# Patient Record
Sex: Female | Born: 1995 | Race: White | Hispanic: No | Smoking: Never smoker
Health system: Southern US, Community
[De-identification: ages and names within clinical notes are randomized; demographics above are authoritative.]

## PROBLEM LIST (undated history)

## (undated) ENCOUNTER — Ambulatory Visit: Admission: EM | Payer: 59 | Source: Home / Self Care

## (undated) DIAGNOSIS — F419 Anxiety disorder, unspecified: Secondary | ICD-10-CM

## (undated) DIAGNOSIS — F909 Attention-deficit hyperactivity disorder, unspecified type: Secondary | ICD-10-CM

## (undated) HISTORY — PX: TONSILLECTOMY AND ADENOIDECTOMY: SUR1326

---

## 2014-09-16 ENCOUNTER — Ambulatory Visit
Admission: EM | Admit: 2014-09-16 | Discharge: 2014-09-16 | Disposition: A | Discharge status: Home / Self Care | Payer: 59 | Attending: Family Medicine | Admitting: Family Medicine

## 2014-09-16 DIAGNOSIS — F419 Anxiety disorder, unspecified: Secondary | ICD-10-CM | POA: Insufficient documentation

## 2014-09-16 DIAGNOSIS — F909 Attention-deficit hyperactivity disorder, unspecified type: Secondary | ICD-10-CM | POA: Diagnosis not present

## 2014-09-16 DIAGNOSIS — L02619 Cutaneous abscess of unspecified foot: Secondary | ICD-10-CM

## 2014-09-16 DIAGNOSIS — L02612 Cutaneous abscess of left foot: Secondary | ICD-10-CM | POA: Insufficient documentation

## 2014-09-16 DIAGNOSIS — S90512A Abrasion, left ankle, initial encounter: Secondary | ICD-10-CM | POA: Diagnosis present

## 2014-09-16 DIAGNOSIS — L03119 Cellulitis of unspecified part of limb: Secondary | ICD-10-CM

## 2014-09-16 DIAGNOSIS — L03116 Cellulitis of left lower limb: Secondary | ICD-10-CM | POA: Insufficient documentation

## 2014-09-16 HISTORY — DX: Attention-deficit hyperactivity disorder, unspecified type: F90.9

## 2014-09-16 HISTORY — DX: Anxiety disorder, unspecified: F41.9

## 2014-09-16 LAB — PREGNANCY, URINE: PREG TEST UR: NEGATIVE

## 2014-09-16 MED ORDER — SULFAMETHOXAZOLE-TRIMETHOPRIM 800-160 MG PO TABS: 1.0000 | ORAL_TABLET | Freq: Two times a day (BID) | ORAL | Status: AC

## 2014-09-16 NOTE — ED Notes (Signed)
Pt reports that she was swimming in a river on Saturday, and scraped her left ankle against a rock. Pt has since noticed redness, swelling, tenderness. Pt has tried Neosporin, with little relief.

## 2014-09-16 NOTE — Discharge Instructions (Signed)

## 2014-09-16 NOTE — ED Provider Notes (Signed)
CSN: [id number]     Arrival date & time 09/16/14  1849 History   First MD Initiated Contact with Patient 09/16/14 1920     Chief Complaint  Patient presents with  . Abrasion   (Consider location/radiation/quality/duration/timing/severity/associated sxs/prior Treatment) HPI 19 year old female presents for evaluation of left ankle pain. She sustained an injury approximately 4 days ago when she scraped the lateral aspect of her ankle on an object in the river. She is concerned that it is becoming infected as it is becoming very red, swollen, and tender to touch. She denies any fever or chills. She is also requesting a urine pregnancy test. Last normal menstrual period was approximately 5 weeks ago. She has had intercourse since that time, but states that a condom was used. Past Medical History  Diagnosis Date  . ADHD (attention deficit hyperactivity disorder)   . Anxiety    Past Surgical History  Procedure Laterality Date  . Tonsillectomy and adenoidectomy     No family history on file. History  Substance Use Topics  . Smoking status: Never Smoker   . Smokeless tobacco: Never Used  . Alcohol Use: Yes     Comment: occasionally   OB History    Gravida Para Term Preterm AB TAB SAB Ectopic Multiple Living       Review of Systems  Constitutional: Negative for fever and chills.  Gastrointestinal: Negative for abdominal distention.  Genitourinary: Positive for menstrual problem.  Neurological: Negative for dizziness and numbness.    Allergies  Mosquito (diagnostic)  Home Medications   Prior to Admission medications   Not on File   BP 114/72 mmHg  Pulse 84  Temp(Src) 97.9 F (36.6 C) (Oral)  Resp 16  Ht  (1.626 m)  Wt 136 lb (61.689 kg)  BMI 23.33 kg/m2  SpO2 100%  LMP 08/07/2014 (Approximate) Physical Exam  Constitutional: She is oriented to person, place, and time. She appears well-developed and well-nourished.  Neurological: She is alert and  oriented to person, place, and time. She has normal reflexes.  Skin: Skin is warm and dry. Rash noted. There is erythema.       ED Course  Procedures (including critical care time) Labs Review Labs Reviewed  PREGNANCY, URINE  Negative  Imaging Review No results found.   MDM   1. Cellulitis and abscess of foot    Patient was given a prescription for Bactrim DS BID, 10 day supply. She was instructed to finish the prescription in its entirety. She was also instructed to return for reevaluation if the redness seems to be worsening rather than improving. All questions were solicited and answered.    Carlean Purl, PA-C 09/16/14 1955

## 2019-03-02 ENCOUNTER — Emergency Department
Admission: EM | Admit: 2019-03-02 | Discharge: 2019-03-03 | Disposition: A | Discharge status: Home / Self Care | Payer: 59 | Attending: Emergency Medicine | Admitting: Emergency Medicine

## 2019-03-02 ENCOUNTER — Emergency Department: Payer: 59

## 2019-03-02 ENCOUNTER — Encounter: Payer: Self-pay | Admitting: Emergency Medicine

## 2019-03-02 ENCOUNTER — Other Ambulatory Visit: Payer: Self-pay

## 2019-03-02 DIAGNOSIS — N83201 Unspecified ovarian cyst, right side: Secondary | ICD-10-CM

## 2019-03-02 DIAGNOSIS — R1031 Right lower quadrant pain: Secondary | ICD-10-CM

## 2019-03-02 DIAGNOSIS — R1011 Right upper quadrant pain: Secondary | ICD-10-CM | POA: Diagnosis not present

## 2019-03-02 LAB — URINALYSIS, COMPLETE (UACMP) WITH MICROSCOPIC
Bilirubin Urine: NEGATIVE
Glucose, UA: NEGATIVE mg/dL
Hgb urine dipstick: NEGATIVE
Ketones, ur: NEGATIVE mg/dL
Nitrite: NEGATIVE
Protein, ur: 30 mg/dL — AB
Specific Gravity, Urine: 1.026 (ref 1.005–1.030)
pH: 6 (ref 5.0–8.0)

## 2019-03-02 LAB — COMPREHENSIVE METABOLIC PANEL
ALT: 11 U/L (ref 0–44)
AST: 17 U/L (ref 15–41)
Albumin: 4.4 g/dL (ref 3.5–5.0)
Alkaline Phosphatase: 50 U/L (ref 38–126)
Anion gap: 7 (ref 5–15)
BUN: 16 mg/dL (ref 6–20)
CO2: 26 mmol/L (ref 22–32)
Calcium: 9 mg/dL (ref 8.9–10.3)
Chloride: 108 mmol/L (ref 98–111)
Creatinine, Ser: 0.73 mg/dL (ref 0.44–1.00)
GFR calc Af Amer: >60 mL/min (ref >60)
GFR calc non Af Amer: >60 mL/min (ref >60)
Glucose, Bld: 80 mg/dL (ref 70–99)
Potassium: 3.6 mmol/L (ref 3.5–5.1)
Sodium: 141 mmol/L (ref 135–145)
Total Bilirubin: 0.6 mg/dL (ref 0.3–1.2)
Total Protein: 7.4 g/dL (ref 6.5–8.1)

## 2019-03-02 LAB — CBC
HCT: 40.2 % (ref 36.0–46.0)
Hemoglobin: 13 g/dL (ref 12.0–15.0)
MCH: 29.6 pg (ref 26.0–34.0)
MCHC: 32.3 g/dL (ref 30.0–36.0)
MCV: 91.6 fL (ref 80.0–100.0)
Platelets: 227 10*3/uL (ref 150–400)
RBC: 4.39 MIL/uL (ref 3.87–5.11)
RDW: 13.2 % (ref 11.5–15.5)
WBC: 10.7 10*3/uL — ABNORMAL HIGH (ref 4.0–10.5)
nRBC: 0 % (ref 0.0–0.2)

## 2019-03-02 LAB — POCT PREGNANCY, URINE: Preg Test, Ur: NEGATIVE

## 2019-03-02 LAB — LIPASE, BLOOD: Lipase: 46 U/L (ref 11–51)

## 2019-03-02 MED ORDER — OXYCODONE-ACETAMINOPHEN 5-325 MG PO TABS
1.0000 | ORAL_TABLET | Freq: Once | ORAL | Status: AC
  Administered 2019-03-02: 1 via ORAL
  Filled 2019-03-02: qty 1

## 2019-03-02 MED ORDER — IOHEXOL 300 MG/ML  SOLN
75.0000 mL | Freq: Once | INTRAMUSCULAR | Status: AC | PRN
  Administered 2019-03-02: 75 mL via INTRAVENOUS
  Filled 2019-03-02: qty 75

## 2019-03-02 MED ORDER — TRAMADOL HCL 50 MG PO TABS: 50.0000 mg | ORAL_TABLET | Freq: Four times a day (QID) | ORAL | 0 refills | Status: AC | PRN

## 2019-03-02 NOTE — ED Provider Notes (Signed)
[facility] Emergency Department Provider Note ____________________________________________   First MD Initiated Contact with Patient 03/02/19 1759     (approximate)  I have reviewed the triage vital signs and the nursing notes.   HISTORY  Chief Complaint Abdominal Pain  HPI Suzanne Berry is a 24 y.o. female presents to the emergency department for treatment and evaluation of right lower abdominal pain.  No fever, nausea, vomiting, or diarrhea. No alleviating measures prior to arrival.        Past Medical History:  Diagnosis Date  . ADHD (attention deficit hyperactivity disorder)   . Anxiety     There are no problems to display for this patient.   Past Surgical History:  Procedure Laterality Date  . TONSILLECTOMY AND ADENOIDECTOMY      Prior to Admission medications   Medication Sig Start Date End Date Taking? Authorizing Provider  traMADol (ULTRAM) 50 MG tablet Take 1 tablet (50 mg total) by mouth every 6 (six) hours as needed for up to 2 days. 03/02/19 03/04/19  Enid Derry, PA-C    Allergies Mosquito (diagnostic)  History reviewed. No pertinent family history.  Social History Social History   Tobacco Use  . Smoking status: Never Smoker  . Smokeless tobacco: Never Used  Substance Use Topics  . Alcohol use: Yes    Comment: occasionally  . Drug use: No    Review of Systems  Constitutional: No fever/chills Eyes: No visual changes. ENT: No sore throat. Cardiovascular: Denies chest pain. Respiratory: Denies shortness of breath. Gastrointestinal: Positive for abdominal pain.  No nausea, no vomiting.  No diarrhea.  No constipation. Genitourinary: Negative for dysuria. Musculoskeletal: Negative for back pain. Skin: Negative for rash. Neurological: Negative for headaches, focal weakness or numbness. ___________________________________________   PHYSICAL EXAM:  VITAL SIGNS: ED Triage Vitals  Enc Vitals Group     BP  03/02/19 1714 128/75     Pulse Rate 03/02/19 1714 99     Resp 03/02/19 1714 16     Temp 03/02/19 1714 98.6 F (37 C)     Temp Source 03/02/19 1714 Oral     SpO2 03/02/19 1714 100 %     Weight 03/02/19 1709 136 lb (61.7 kg)     Height 03/02/19 1709 5\' 4"  (1.626 m)     Head Circumference --      Peak Flow --      Pain Score 03/02/19 1709 8     Pain Loc --      Pain Edu? --      Excl. in GC? --     Constitutional: Alert and oriented. Well appearing and in no acute distress. Eyes: Conjunctivae are normal. PERRL. Head: Atraumatic. Nose: No congestion/rhinnorhea. Mouth/Throat: Mucous membranes are moist.  Oropharynx non-erythematous. Neck: No stridor.   Hematological/Lymphatic/Immunilogical: No cervical lymphadenopathy. Cardiovascular: Normal rate, regular rhythm. Grossly normal heart sounds.  Good peripheral circulation. Respiratory: Normal respiratory effort.  No retractions. Lungs CTAB. Gastrointestinal: Soft and tender in the right upper quadrant and mildly tender in the right lower quadrant without rebound or guarding. No distention. No abdominal bruits. No CVA tenderness. Musculoskeletal: No lower extremity tenderness nor edema.  No joint effusions. Neurologic:  Normal speech and language. No gross focal neurologic deficits are appreciated. No gait instability. Skin:  Skin is warm, dry and intact. No rash noted. Psychiatric: Mood and affect are normal. Speech and behavior are normal.  ____________________________________________   LABS (all labs ordered are listed, but only abnormal results are displayed)  Labs Reviewed  CBC - Abnormal; Notable for the following components:      Result Value   WBC 10.7 (*)    All other components within normal limits  URINALYSIS, COMPLETE (UACMP) WITH MICROSCOPIC - Abnormal; Notable for the following components:   Color, Urine YELLOW (*)    APPearance CLOUDY (*)    Protein, ur 30 (*)    Leukocytes,Ua SMALL (*)    Bacteria, UA RARE (*)      All other components within normal limits  LIPASE, BLOOD  COMPREHENSIVE METABOLIC PANEL  POC URINE PREG, ED  POCT PREGNANCY, URINE   ____________________________________________  EKG  Not indicated. ____________________________________________  RADIOLOGY  ED MD interpretation:    Negative RUQ ultrasound.  Official radiology report(s): CT ABDOMEN PELVIS W CONTRAST  Result Date: 03/02/2019 CLINICAL DATA:  Unspecified abdominal pain. Cramping and left lower abdominal pain today. EXAM: CT ABDOMEN AND PELVIS WITH CONTRAST TECHNIQUE: Multidetector CT imaging of the abdomen and pelvis was performed using the standard protocol following bolus administration of intravenous contrast. CONTRAST:  54mL OMNIPAQUE IOHEXOL 300 MG/ML  SOLN COMPARISON:  Right upper quadrant ultrasound today. FINDINGS: Lower chest: The lung bases are clear. Hepatobiliary: No focal liver abnormality is seen. No gallstones, gallbladder wall thickening, or biliary dilatation. Pancreas: No ductal dilatation or inflammation. Spleen: Normal in size without focal abnormality. Adrenals/Urinary Tract: Normal adrenal glands. No hydronephrosis or perinephric edema. Homogeneous renal enhancement. Urinary bladder is partially distended without wall thickening. Stomach/Bowel: Stomach is within normal limits. Appendix appears normal, coursing in the right pericolic gutter with tip abutting the inferior liver. No evidence of bowel wall thickening, distention, or inflammatory changes. Moderate to large volume of stool throughout the colon. Vascular/Lymphatic: Normal caliber abdominal aorta. The portal vein is patent. Acute vascular findings. No adenopathy. Reproductive: Simple cyst in the right ovary measures 3.2 cm. Uterus and left ovary are unremarkable. Other: No free air, free fluid, or intra-abdominal fluid collection. Tiny fat containing umbilical hernia. Musculoskeletal: There are no acute or suspicious osseous abnormalities.  IMPRESSION: 1. Moderate to large volume of stool throughout the colon, can be seen with constipation. 2. Simple 3.2 cm right ovarian cyst. Unless there is focal right lower quadrant pain, no dedicated imaging follow-up is needed. This recommendation follows ACR consensus guidelines: White Paper of the ACR Incidental Findings Committee II on Adnexal Findings. J Am Coll Radiol 2013:10: Electronically Signed   By: Keith Rake M.D.   On: 03/02/2019 21:20   US PELVIC COMPLETE W TRANSVAGINAL AND TORSION R/O  Result Date: 03/02/2019 CLINICAL DATA:  Right lower quadrant pain. Right ovarian cyst on CT. EXAM: TRANSABDOMINAL AND TRANSVAGINAL ULTRASOUND OF PELVIS DOPPLER ULTRASOUND OF OVARIES TECHNIQUE: Both transabdominal and transvaginal ultrasound examinations of the pelvis were performed. Transabdominal technique was performed for global imaging of the pelvis including uterus, ovaries, adnexal regions, and pelvic cul-de-sac. It was necessary to proceed with endovaginal exam following the transabdominal exam to visualize the uterus, ovaries and adnexa. Color and duplex Doppler ultrasound was utilized to evaluate blood flow to the ovaries. COMPARISON:  Abdominal CT earlier this day FINDINGS: Uterus Measurements: 8.1 x 3.4 x 4.3 cm = volume: 62.9 mL. No fibroids or other mass visualized. Endometrium Thickness: 5 mm.  No focal abnormality visualized. Right ovary Measurements: 4.0 x 3.9 x 4.1 cm = volume: 33.4 mL. Simple cyst measures 3.5 x 3.1 x 3.3 cm. Normal blood flow to the adjacent ovarian parenchyma. No extra ovarian mass. Left ovary Measurements: 3.6 x 2.2 x 2.4 cm =  volume: 7.1 mL. Normal appearance/no adnexal mass. Pulsed Doppler evaluation of both ovaries demonstrates normal low-resistance arterial and venous waveforms. Other findings No abnormal free fluid. IMPRESSION: 1. Simple right ovarian cyst measuring 3.5 cm. No dedicated imaging follow-up is needed. 2. Normal blood flow to both ovaries without  torsion. 3. Normal sonographic appearance of the uterus. Electronically Signed   By: Narda Rutherford M.D.   On: 03/02/2019 23:57   US Abdomen Limited RUQ  Result Date: 03/02/2019 CLINICAL DATA:  Right upper quadrant abdominal pain. EXAM: ULTRASOUND ABDOMEN LIMITED RIGHT UPPER QUADRANT COMPARISON:  None. FINDINGS: Gallbladder: No gallstones or wall thickening visualized. No sonographic Murphy sign noted by sonographer. Common bile duct: Diameter: 2 mm Liver: No focal lesion identified. Within normal limits in parenchymal echogenicity. Portal vein is patent on color Doppler imaging with normal direction of blood flow towards the liver. Other: None. IMPRESSION: Normal study. Electronically Signed   By: Katherine Mantle M.D.   On: 03/02/2019 19:41    ____________________________________________   PROCEDURES  Procedure(s) performed (including Critical Care):  Procedures  ____________________________________________   INITIAL IMPRESSION / ASSESSMENT AND PLAN     24 year old female presenting to the emergency department for treatment and evaluation of abdominal pain.  See HPI for further details.  Plan will be to draw labs and start with a ultrasound of the right upper quadrant as this is the most focal location of her pain.  On exam, it is difficult to isolate a single area of pain.  If the ultrasound of the right upper quadrant is negative.  Will perform CT of the abdomen and pelvis.  Patient is aware and agrees with this plan.  DIFFERENTIAL DIAGNOSIS  Acute cholecystitis, cholelithiasis, appendicitis, diverticulitis, constipation, bowel gas, adenitis  ED COURSE  Vital signs are stable.  She does have very mild leukocytosis at 10.7 without a left shift.  Right upper quadrant ultrasound is negative.  We will proceed with CT of the abdomen pelvis as decided previously.  Patient care will be transitioned to Enid Derry, PA-C for final evaluation and  diagnosis. ____________________________________________   FINAL CLINICAL IMPRESSION(S) / ED DIAGNOSES  Final diagnoses:  RUQ pain  RLQ discomfort  Cyst of right ovary     ED Discharge Orders         Ordered    traMADol (ULTRAM) 50 MG tablet  Every 6 hours PRN     03/02/19 2352           Note:  This document was prepared using Dragon voice recognition software and may include unintentional dictation errors.   Chinita Pester, FNP 03/03/19 1655    Emily Filbert, MD 03/03/19 [phone]

## 2019-03-02 NOTE — ED Triage Notes (Signed)
Pt c/o right lower abdominal pain. Denies fever, NVD. NAD. Ambulatory. Color WNL.

## 2019-03-02 NOTE — ED Notes (Addendum)
Pt with cramping for over a week and severe pain in left lower abdomen today. Pt denies N/V/D.

## 2019-03-02 NOTE — ED Provider Notes (Signed)
-----------------------------------------   9:29 PM on 03/02/2019 -----------------------------------------  Blood pressure 128/75, pulse 99, temperature 98.6 F (37 C), temperature source Oral, resp. rate 16, height 5\' 4"  (1.626 m), weight 61.7 kg, last menstrual period 02/13/2019, SpO2 100 %.  Assuming care from [facility].  In short, Suzanne Berry is a 24 y.o. female with a chief complaint of right lower quadrant Abdominal Pain for one week, worsening today. Refer to the original H&P for additional details.   Patient has a mild leukocytosis of 10.7.  Remaining labs are unremarkable.  Patient has some white blood cells, leukocytes, rare bacteria on urinalysis but denies any urinary symptoms.  Right upper quadrant ultrasound within normal limits.  CT scan consistent with moderate stool burden and 3.2 cm right ovarian cyst.  Pelvic ultrasound with torsion rule out was ordered.  Pelvic ultrasound consistent with right ovarian cyst and no torsion.  Patient will be discharged home with prescription for tramadol for pain.  Referral to gynecology was given.  Return precautions for the emergency department were given.  Patient agreeable and comfortable with plan of care.    22, PA-C 03/03/19 0003    03/05/19, MD 03/07/19 [phone]

## 2019-03-12 ENCOUNTER — Emergency Department: Payer: 59

## 2019-03-12 ENCOUNTER — Other Ambulatory Visit: Payer: Self-pay

## 2019-03-12 ENCOUNTER — Emergency Department
Admission: EM | Admit: 2019-03-12 | Discharge: 2019-03-12 | Disposition: A | Discharge status: Home / Self Care | Payer: 59 | Attending: Emergency Medicine | Admitting: Emergency Medicine

## 2019-03-12 DIAGNOSIS — R569 Unspecified convulsions: Secondary | ICD-10-CM | POA: Diagnosis not present

## 2019-03-12 LAB — URINALYSIS, COMPLETE (UACMP) WITH MICROSCOPIC
Bacteria, UA: NONE SEEN
Bilirubin Urine: NEGATIVE
Glucose, UA: NEGATIVE mg/dL
Ketones, ur: 5 mg/dL — AB
Leukocytes,Ua: NEGATIVE
Nitrite: NEGATIVE
Protein, ur: 100 mg/dL — AB
Specific Gravity, Urine: 1.024 (ref 1.005–1.030)
pH: 5 (ref 5.0–8.0)

## 2019-03-12 LAB — POCT PREGNANCY, URINE: Preg Test, Ur: NEGATIVE

## 2019-03-12 LAB — COMPREHENSIVE METABOLIC PANEL
ALT: 11 U/L (ref 0–44)
AST: 27 U/L (ref 15–41)
Albumin: 4.8 g/dL (ref 3.5–5.0)
Alkaline Phosphatase: 53 U/L (ref 38–126)
Anion gap: 13 (ref 5–15)
BUN: 10 mg/dL (ref 6–20)
CO2: 16 mmol/L — ABNORMAL LOW (ref 22–32)
Calcium: 9.2 mg/dL (ref 8.9–10.3)
Chloride: 108 mmol/L (ref 98–111)
Creatinine, Ser: 0.94 mg/dL (ref 0.44–1.00)
GFR calc Af Amer: >60 mL/min (ref >60)
GFR calc non Af Amer: >60 mL/min (ref >60)
Glucose, Bld: 135 mg/dL — ABNORMAL HIGH (ref 70–99)
Potassium: 3.5 mmol/L (ref 3.5–5.1)
Sodium: 137 mmol/L (ref 135–145)
Total Bilirubin: 1.1 mg/dL (ref 0.3–1.2)
Total Protein: 7.5 g/dL (ref 6.5–8.1)

## 2019-03-12 LAB — CBC WITH DIFFERENTIAL/PLATELET
Abs Immature Granulocytes: 0.06 10*3/uL (ref 0.00–0.07)
Basophils Absolute: 0 10*3/uL (ref 0.0–0.1)
Basophils Relative: 0 %
Eosinophils Absolute: 0 10*3/uL (ref 0.0–0.5)
Eosinophils Relative: 0 %
HCT: 42.6 % (ref 36.0–46.0)
Hemoglobin: 14.1 g/dL (ref 12.0–15.0)
Immature Granulocytes: 1 %
Lymphocytes Relative: 24 %
Lymphs Abs: 2.3 10*3/uL (ref 0.7–4.0)
MCH: 29.9 pg (ref 26.0–34.0)
MCHC: 33.1 g/dL (ref 30.0–36.0)
MCV: 90.4 fL (ref 80.0–100.0)
Monocytes Absolute: 0.7 10*3/uL (ref 0.1–1.0)
Monocytes Relative: 7 %
Neutro Abs: 6.8 10*3/uL (ref 1.7–7.7)
Neutrophils Relative %: 68 %
Platelets: 205 10*3/uL (ref 150–400)
RBC: 4.71 MIL/uL (ref 3.87–5.11)
RDW: 13.2 % (ref 11.5–15.5)
WBC: 9.9 10*3/uL (ref 4.0–10.5)
nRBC: 0 % (ref 0.0–0.2)

## 2019-03-12 LAB — GLUCOSE, CAPILLARY: Glucose-Capillary: 129 mg/dL — ABNORMAL HIGH (ref 70–99)

## 2019-03-12 MED ORDER — SODIUM CHLORIDE 0.9 % IV BOLUS
1000.0000 mL | Freq: Once | INTRAVENOUS | Status: AC
  Administered 2019-03-12: 17:00:00 1000 mL via INTRAVENOUS

## 2019-03-12 NOTE — ED Notes (Signed)
Pt given phone to update parents.

## 2019-03-12 NOTE — ED Notes (Signed)
Pt assisted to bathroom

## 2019-03-12 NOTE — ED Provider Notes (Signed)
[facility] Emergency Department Provider Note ____________________________________________   First MD Initiated Contact with Patient 03/12/19 1643     (approximate)  I have reviewed the triage vital signs and the nursing notes.   HISTORY  Chief Complaint Seizures    HPI Suzanne Berry is a 24 y.o. female with PMH as noted below who presents with a possible seizure or syncope.  The patient states that she does not member anything about what happened, but awoke to her coworkers around her.  They reported seizure-like activity EMS.  The patient states that she was feeling fine earlier, and denies any lightheadedness, nausea, or other preceding symptoms.  She states that she has had "seizures" before, but describes those as episodes where she just "spaces out" and has not had an episode like this where she lost consciousness.  She has not had any work-up for this before.  She did not bite her tongue and had no incontinence.  She denies any specific symptoms now other than feeling a bit tired.  She states she is not on any medications currently.  She was on Wellbutrin before, but stopped taking around a year ago.  Past Medical History:  Diagnosis Date  . ADHD (attention deficit hyperactivity disorder)   . Anxiety     There are no problems to display for this patient.   Past Surgical History:  Procedure Laterality Date  . TONSILLECTOMY AND ADENOIDECTOMY      Prior to Admission medications   Not on File    Allergies Mosquito (diagnostic)  History reviewed. No pertinent family history.  Social History Social History   Tobacco Use  . Smoking status: Never Smoker  . Smokeless tobacco: Never Used  Substance Use Topics  . Alcohol use: Yes    Comment: occasionally  . Drug use: No    Review of Systems  Constitutional: No fever. Eyes: No redness. ENT: No sore throat. Cardiovascular: Denies chest pain. Respiratory: Denies shortness of  breath. Gastrointestinal: No vomiting or diarrhea.  Genitourinary: Negative for dysuria.  Musculoskeletal: Negative for back pain. Skin: Negative for rash. Neurological: Negative for headache.   ____________________________________________   PHYSICAL EXAM:  VITAL SIGNS: ED Triage Vitals  Enc Vitals Group     BP 03/12/19 1638 (!) 146/81     Pulse Rate 03/12/19 1642 (!) 120     Resp 03/12/19 1642 (!) 26     Temp 03/12/19 1639 99.1 F (37.3 C)     Temp Source 03/12/19 1639 Oral     SpO2 03/12/19 1642 100 %     Weight 03/12/19 1639 130 lb (59 kg)     Height 03/12/19 1639 5\' 4"  (1.626 m)     Head Circumference --      Peak Flow --      Pain Score 03/12/19 1639 0     Pain Loc --      Pain Edu? --      Excl. in GC? --     Constitutional: Alert and oriented.  Relatively well appearing and in no acute distress. Eyes: Conjunctivae are normal.  EOMI.  PERRLA. Head: Atraumatic. Nose: No congestion/rhinnorhea. Mouth/Throat: Mucous membranes are moist.   Neck: Normal range of motion.  No meningeal signs. Cardiovascular: Tachycardic, regular rhythm.   Good peripheral circulation. Respiratory: Normal respiratory effort.  No retractions.  Gastrointestinal: Soft and nontender.  Musculoskeletal: Extremities warm and well perfused.  Neurologic:  Normal speech and language.  Motor and sensory intact in all extremities.  Normal  coordination on finger-to-nose.  No pronator drift. Skin:  Skin is warm and dry. No rash noted. Psychiatric: Mood and affect are normal. Speech and behavior are normal.  ____________________________________________   LABS (all labs ordered are listed, but only abnormal results are displayed)  Labs Reviewed  COMPREHENSIVE METABOLIC PANEL - Abnormal; Notable for the following components:      Result Value   CO2 16 (*)    Glucose, Bld 135 (*)    All other components within normal limits  URINALYSIS, COMPLETE (UACMP) WITH MICROSCOPIC - Abnormal; Notable for the  following components:   Color, Urine YELLOW (*)    APPearance CLOUDY (*)    Hgb urine dipstick LARGE (*)    Ketones, ur 5 (*)    Protein, ur 100 (*)    All other components within normal limits  GLUCOSE, CAPILLARY - Abnormal; Notable for the following components:   Glucose-Capillary 129 (*)    All other components within normal limits  CBC WITH DIFFERENTIAL/PLATELET  POC URINE PREG, ED  POCT PREGNANCY, URINE   ____________________________________________  EKG  ED ECG REPORT I, Arta Silence, the attending physician, personally viewed and interpreted this ECG.  Date: 03/12/2019 EKG Time: 1641 Rate: 107 Rhythm: Sinus tachycardia QRS Axis: Borderline right axis Intervals: normal ST/T Wave abnormalities: normal Narrative Interpretation: no evidence of acute ischemia  ____________________________________________  RADIOLOGY  CT head: No acute abnormality  ____________________________________________   PROCEDURES  Procedure(s) performed: No  Procedures  Critical Care performed: No ____________________________________________   INITIAL IMPRESSION / ASSESSMENT AND PLAN / ED COURSE  Pertinent labs & imaging results that were available during my care of the patient were reviewed by me and considered in my medical decision making (see chart for details).  24 year old female with PMH as noted above presents with possible seizure versus syncope.  The patient states that she suddenly lost consciousness while at work at a [employer], and had no significant prodromal symptoms.  Per EMS, coworkers reported some seizure-like activity.  The patient has had previous episodes which she refers to as "seizures" but has not lost consciousness during them before.  She has not had a prior work-up for this.  I reviewed the past medical records in [place].  The patient was last seen in our ED on 1/23 for abdominal pain.  She has no other recent ED visits or admissions, and I do  not see any prior visits for seizure-like episodes or any brain imaging.  On exam, the patient is slightly tachycardic with otherwise normal vital signs.  She is alert and oriented x4 and comfortable appearing.  Neurologic exam is normal.  The physical exam is otherwise unremarkable.  Etiology of the episode is slightly unclear.  The lack of prodrome and the reported seizure-like activity favor a seizure, although patient had no tongue biting or incontinence.  Vasovagal or other syncope is on the differential.  We will obtain a first-time seizure type work-up with EKG, labs, and CT head.  If this work-up is negative and the patient has no further symptoms or other episodes in the, I anticipate discharge home with close outpatient follow-up.  ----------------------------------------- 7:33 PM on 03/12/2019 -----------------------------------------  CT head shows no acute findings.  Lab work-up is unremarkable.  The patient's UA shows some protein and WBCs, but no specific findings to suggest infection.  She has no urinary symptoms.  There is no indication for further work-up at this time.  On reassessment, the patient feels comfortable.  She has not  had any further episodes in the ED.  She is stable for discharge home at this time.  I instructed her to follow-up with her PMD.  There is no indication to start her on an antiepileptic at this time, she will need additional outpatient work-up.  I had an extensive discussion with her about the results of the work-up and the plan of care.  Return precautions given, and she expresses understanding.  ____________________________________________   FINAL CLINICAL IMPRESSION(S) / ED DIAGNOSES  Final diagnoses:  Seizure-like activity (HCC)      NEW MEDICATIONS STARTED DURING THIS VISIT:  There are no discharge medications for this patient.    Note:  This document was prepared using Dragon voice recognition software and may include unintentional  dictation errors.    Dionne Bucy, MD 03/12/19 [phone]

## 2019-03-12 NOTE — ED Triage Notes (Signed)
Pt here via [facility] from work after having a witnessed seizure. Pt does not remember the events leading up to the seizure.   Pt's last seizure was 6 months ago but she did not follow up about it. Has also had several seizures prior to that.   EMS VSS- cbg 152, hr 120, bp 136/78, o2 98% ra.

## 2019-03-12 NOTE — Discharge Instructions (Addendum)
It is possible that you had a seizure, although you also may just have passed out (which is called syncope).  You should make an appointment to follow-up with your primary care doctor within the next few weeks.  Return to the ER immediately for new, worsening, or recurrent episodes like this, dizziness, weakness, or any other new or worsening symptoms that concern you.

## 2019-03-12 NOTE — ED Notes (Signed)
ED Provider at bedside. 

## 2019-05-11 ENCOUNTER — Encounter: Payer: Self-pay | Admitting: Emergency Medicine

## 2019-05-11 ENCOUNTER — Other Ambulatory Visit: Payer: Self-pay

## 2019-05-11 ENCOUNTER — Ambulatory Visit
Admission: EM | Admit: 2019-05-11 | Discharge: 2019-05-11 | Disposition: A | Discharge status: Home / Self Care | Payer: 59 | Attending: Family Medicine | Admitting: Family Medicine

## 2019-05-11 DIAGNOSIS — R1032 Left lower quadrant pain: Secondary | ICD-10-CM | POA: Insufficient documentation

## 2019-05-11 DIAGNOSIS — N939 Abnormal uterine and vaginal bleeding, unspecified: Secondary | ICD-10-CM | POA: Diagnosis present

## 2019-05-11 DIAGNOSIS — R1031 Right lower quadrant pain: Secondary | ICD-10-CM

## 2019-05-11 LAB — CBC WITH DIFFERENTIAL/PLATELET
Abs Immature Granulocytes: 0.01 10*3/uL (ref 0.00–0.07)
Basophils Absolute: 0 10*3/uL (ref 0.0–0.1)
Basophils Relative: 1 %
Eosinophils Absolute: 0 10*3/uL (ref 0.0–0.5)
Eosinophils Relative: 0 %
HCT: 37.6 % (ref 36.0–46.0)
Hemoglobin: 12.5 g/dL (ref 12.0–15.0)
Immature Granulocytes: 0 %
Lymphocytes Relative: 25 %
Lymphs Abs: 1.9 10*3/uL (ref 0.7–4.0)
MCH: 30.1 pg (ref 26.0–34.0)
MCHC: 33.2 g/dL (ref 30.0–36.0)
MCV: 90.6 fL (ref 80.0–100.0)
Monocytes Absolute: 0.7 10*3/uL (ref 0.1–1.0)
Monocytes Relative: 9 %
Neutro Abs: 5 10*3/uL (ref 1.7–7.7)
Neutrophils Relative %: 65 %
Platelets: 225 10*3/uL (ref 150–400)
RBC: 4.15 MIL/uL (ref 3.87–5.11)
RDW: 13.6 % (ref 11.5–15.5)
WBC: 7.7 10*3/uL (ref 4.0–10.5)
nRBC: 0 % (ref 0.0–0.2)

## 2019-05-11 MED ORDER — KETOROLAC TROMETHAMINE 60 MG/2ML IM SOLN
60.0000 mg | Freq: Once | INTRAMUSCULAR | Status: AC
  Administered 2019-05-11: 17:00:00 60 mg via INTRAMUSCULAR

## 2019-05-11 MED ORDER — TRAMADOL HCL 50 MG PO TABS: ORAL_TABLET | ORAL | 0 refills | Status: AC

## 2019-05-11 NOTE — ED Triage Notes (Signed)
Patient in today c/o abdominal pain x 2-3 days, getting worse today. Patient has been on her menstrual cycle for 19 days. Patient has a history of ovarian cysts.

## 2019-05-11 NOTE — Discharge Instructions (Addendum)
Follow up with Primary Care provider and/or gynecologist

## 2019-05-11 NOTE — ED Provider Notes (Signed)
[facility]    CSN: [id number] Arrival date & time: 05/11/19  1537      History   Chief Complaint Chief Complaint  Patient presents with  . Abdominal Pain    HPI Suzanne Berry is a 24 y.o. female.   24 yo female with a c/o lower bilateral abdominal pain for the past 2-3 days associated with intermittent vaginal bleeding and spotting for the past 19 days. Denies any fevers, chills, vaginal discharge, dysuria, nausea, vomiting, diarrhea, constipation, melena, hematochezia. Patient states she has a h/o ovarian cysts and has had similar pain episodes in the past. States she's had tramadol prescribed in the past which has helped. Denies any chance of pregnancy as she states that she has not had any intercourse this year.    Abdominal Pain   Past Medical History:  Diagnosis Date  . ADHD (attention deficit hyperactivity disorder)   . Anxiety     There are no problems to display for this patient.   Past Surgical History:  Procedure Laterality Date  . TONSILLECTOMY AND ADENOIDECTOMY      OB History    Gravida  0   Para  0   Term  0   Preterm  0   AB  0   Living  0     SAB  0   TAB  0   Ectopic  0   Multiple  0   Live Births               Home Medications    Prior to Admission medications   Medication Sig Start Date End Date Taking? Authorizing Provider  traMADol Janean Sark) 50 MG tablet 1-2 tabs po bid prn 05/11/19   Payton Mccallum, MD    Family History Family History  Problem Relation Age of Onset  . Thyroid disease Mother   . Depression Mother   . Hypertension Father     Social History Social History   Tobacco Use  . Smoking status: Never Smoker  . Smokeless tobacco: Never Used  Substance Use Topics  . Alcohol use: Yes    Comment: occasionally  . Drug use: Yes    Frequency: 3.0 times per week    Types: Marijuana     Allergies   Mosquito (diagnostic)   Review of Systems Review of Systems  Gastrointestinal: Positive  for abdominal pain.     Physical Exam Triage Vital Signs ED Triage Vitals  Enc Vitals Group     BP 05/11/19 1554 113/73     Pulse Rate 05/11/19 1554 88     Resp 05/11/19 1554 18     Temp 05/11/19 1554 97.9 F (36.6 C)     Temp Source 05/11/19 1554 Oral     SpO2 05/11/19 1554 100 %     Weight 05/11/19 1549 133 lb (60.3 kg)     Height 05/11/19 1549 5\' 4"  (1.626 m)     Head Circumference --      Peak Flow --      Pain Score 05/11/19 1549 6     Pain Loc --      Pain Edu? --      Excl. in GC? --    No data found.  Updated Vital Signs BP 113/73 (BP Location: Left Arm)   Pulse 88   Temp 97.9 F (36.6 C) (Oral)   Resp 18   Ht 5\' 4"  (1.626 m)   Wt 60.3 kg   LMP 04/23/2019 (Exact Date)  SpO2 100%   BMI 22.83 kg/m   Visual Acuity Right Eye Distance:   Left Eye Distance:   Bilateral Distance:    Right Eye Near:   Left Eye Near:    Bilateral Near:     Physical Exam Vitals and nursing note reviewed.  Constitutional:      General: She is not in acute distress.    Appearance: She is not toxic-appearing or diaphoretic.  Neurological:     Mental Status: She is alert.      UC Treatments / Results  Labs (all labs ordered are listed, but only abnormal results are displayed) Labs Reviewed  CBC WITH DIFFERENTIAL/PLATELET    EKG   Radiology No results found.  Procedures Procedures (including critical care time)  Medications Ordered in UC Medications  ketorolac (TORADOL) injection 60 mg (60 mg Intramuscular Given 05/11/19 1632)    Initial Impression / Assessment and Plan / UC Course  I have reviewed the triage vital signs and the nursing notes.  Pertinent labs & imaging results that were available during my care of the patient were reviewed by me and considered in my medical decision making (see chart for details).      Final Clinical Impressions(s) / UC Diagnoses   Final diagnoses:  Bilateral lower abdominal pain    ED Prescriptions    Medication  Sig Dispense Auth. Provider   traMADol (ULTRAM) 50 MG tablet 1-2 tabs po bid prn 15 tablet Norval Gable, MD      1. Lab results (reviewed by me) and diagnosis reviewed with patient 2.patient given toradol 60mg  IM x 1 3.  rx as per orders above; reviewed possible side effects, interactions, risks and benefits  4. Recommend supportive treatment with otc anti-inflammatories 5. Go to Emergency Department if symptoms worsen 6. Follow up with OB/GYN 7. Follow-up prn    I have reviewed the PDMP during this encounter.   Norval Gable, MD 05/11/19 1640

## 2020-12-04 IMAGING — CT CT ABD-PELV W/ CM
2 of 4 series · 15 of 46 positions shown, 17 images · IV contrast (APPLIED)
Comparison: Right upper quadrant ultrasound today.

CLINICAL DATA: Unspecified abdominal pain. Cramping and left lower
abdominal pain today.

EXAM:
CT ABDOMEN AND PELVIS WITH CONTRAST
TECHNIQUE: Multidetector CT imaging of the abdomen and pelvis was performed
using the standard protocol following bolus administration of
intravenous contrast.
CONTRAST:  75mL OMNIPAQUE IOHEXOL 300 MG/ML  SOLN

[Series 2: routine abd/pel with · axial · 0.68mm/px · z∈[-741,-326]mm · 12 of 91 slices shown, 14 images]
[im 4/91  soft-tissue]
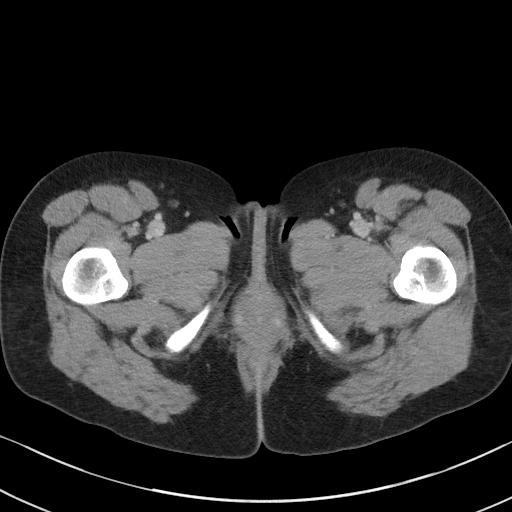
[im 4/91  bone]
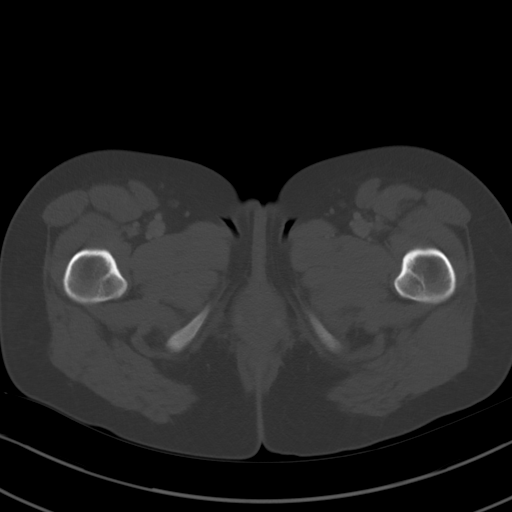
[im 12/91  soft-tissue]
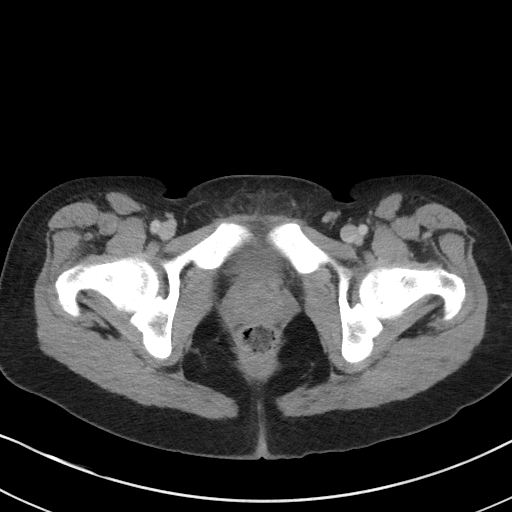
[im 20/91  soft-tissue]
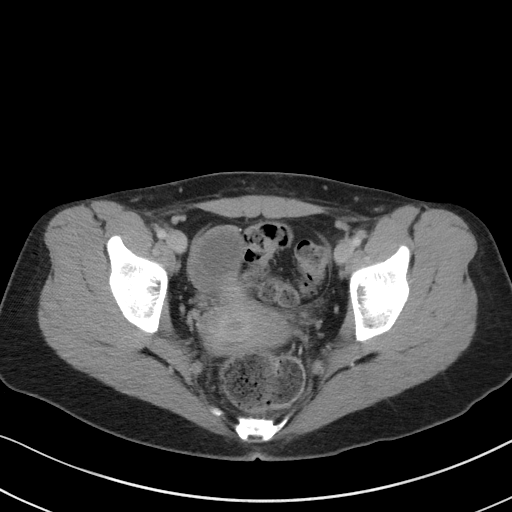
[im 28/91  soft-tissue]
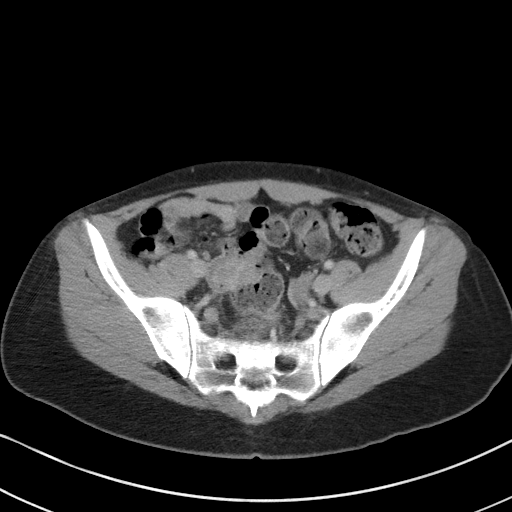
[im 36/91  soft-tissue]
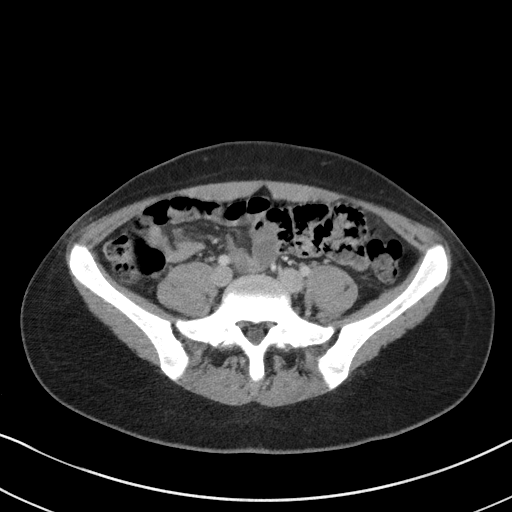
[im 44/91  soft-tissue]
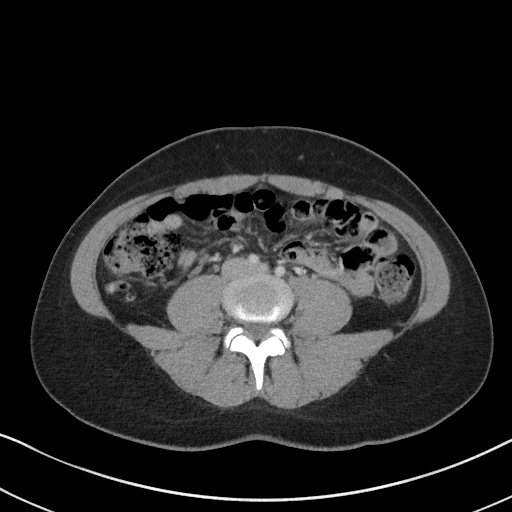
[im 47/91  soft-tissue]
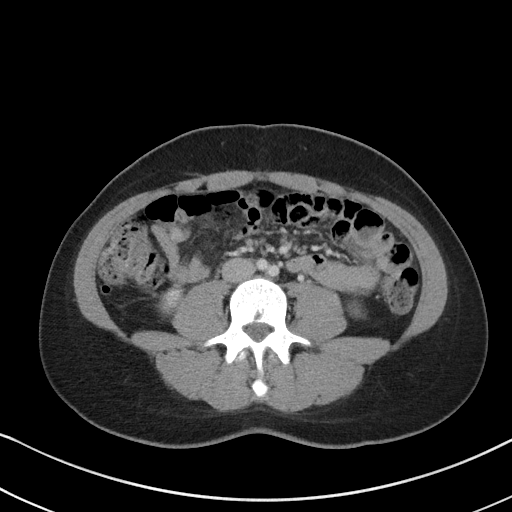
[im 55/91  soft-tissue]
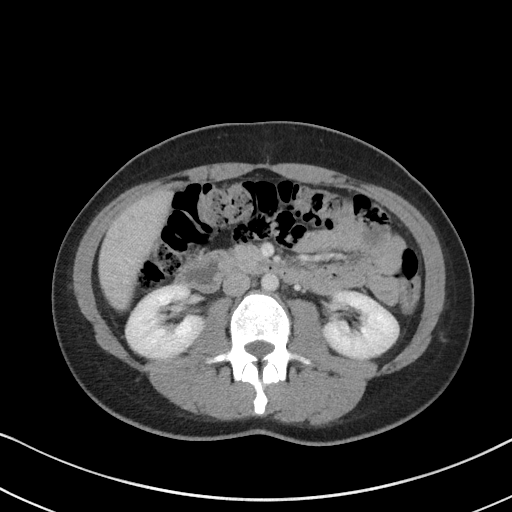
[im 63/91  soft-tissue]
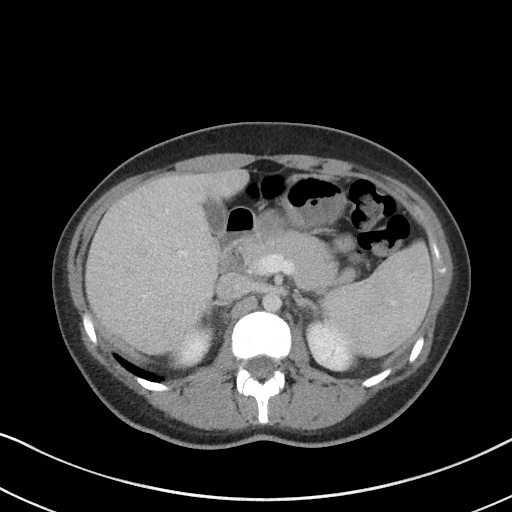
[im 63/91  bone]
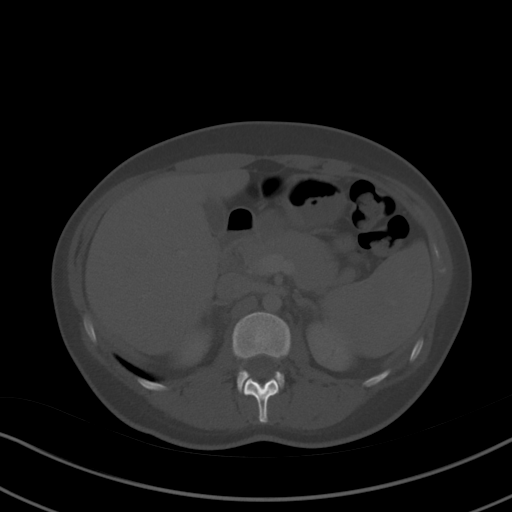
[im 71/91  soft-tissue]
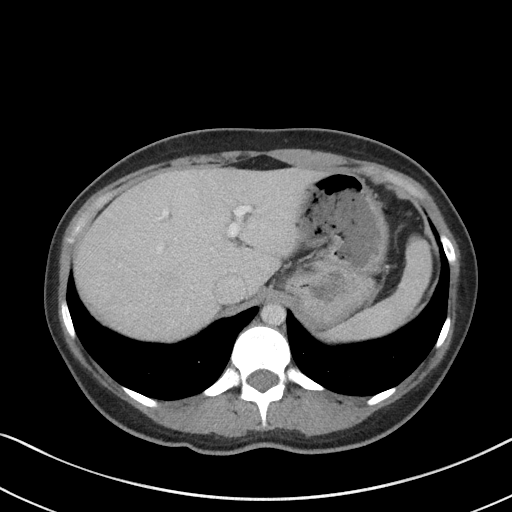
[im 79/91  soft-tissue]
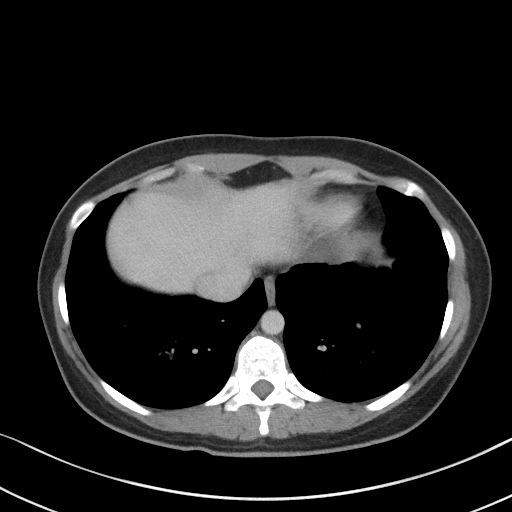
[im 87/91  soft-tissue]
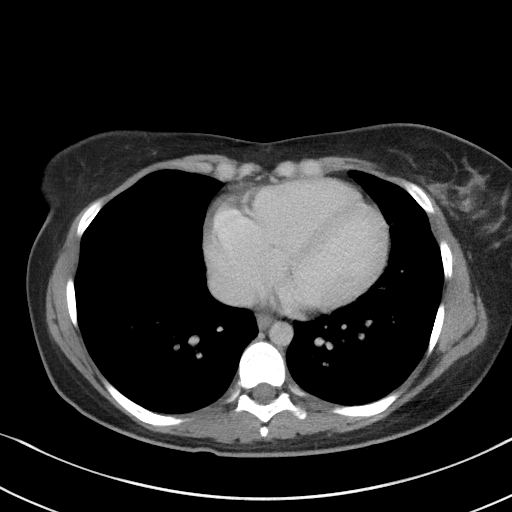

[Series 5: coronal st · coronal · 0.65mm/px · 3 of 74 slices shown]
[im 25/74  soft-tissue]
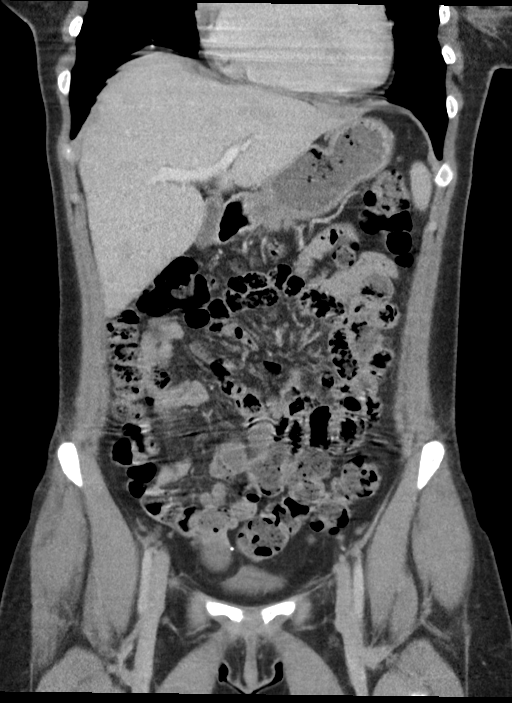
[im 33/74  soft-tissue]
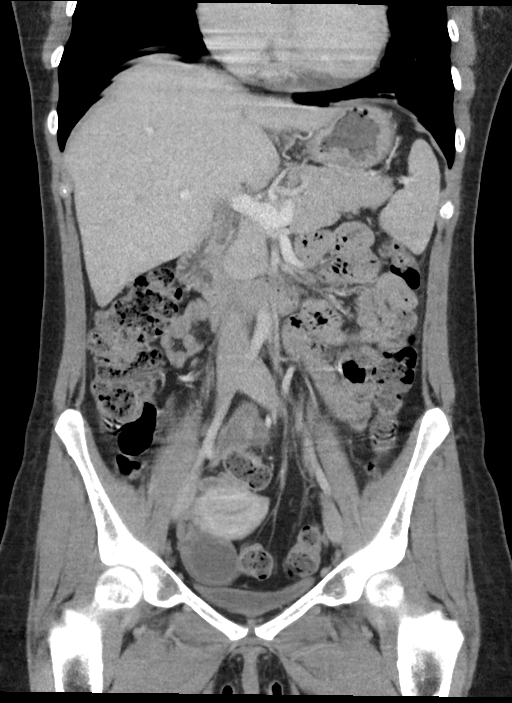
[im 41/74  soft-tissue]
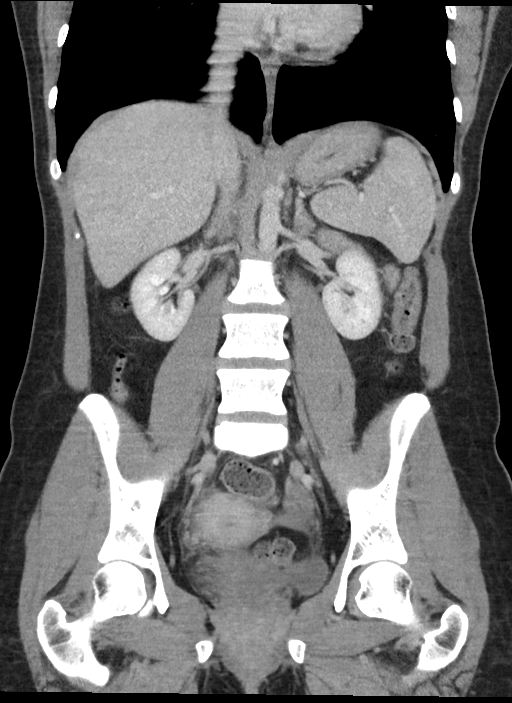

[15 of 46 positions shown; findings below may reference images not displayed]

FINDINGS: Lower chest: The lung bases are clear.

Hepatobiliary: No focal liver abnormality is seen. No gallstones,
gallbladder wall thickening, or biliary dilatation.

Pancreas: No ductal dilatation or inflammation.

Spleen: Normal in size without focal abnormality.

Adrenals/Urinary Tract: Normal adrenal glands. No hydronephrosis or
perinephric edema. Homogeneous renal enhancement. Urinary bladder is
partially distended without wall thickening.

Stomach/Bowel: Stomach is within normal limits. Appendix appears
normal, coursing in the right pericolic gutter with tip abutting the
inferior liver. No evidence of bowel wall thickening, distention, or
inflammatory changes. Moderate to large volume of stool throughout
the colon.

Vascular/Lymphatic: Normal caliber abdominal aorta. The portal vein
is patent. Acute vascular findings. No adenopathy.

Reproductive: Simple cyst in the right ovary measures 3.2 cm. Uterus
and left ovary are unremarkable.

Other: No free air, free fluid, or intra-abdominal fluid collection.
Tiny fat containing umbilical hernia.

Musculoskeletal: There are no acute or suspicious osseous
abnormalities.
IMPRESSION: 1. Moderate to large volume of stool throughout the colon, can be
seen with constipation.
2. Simple 3.2 cm right ovarian cyst. Unless there is focal right
lower quadrant pain, no dedicated imaging follow-up is needed. This
recommendation follows ACR consensus guidelines: White Paper of the
ACR Incidental Findings Committee II on Adnexal Findings. J Am Coll

## 2021-02-10 ENCOUNTER — Other Ambulatory Visit: Payer: Self-pay

## 2023-02-10 ENCOUNTER — Ambulatory Visit
Admission: EM | Admit: 2023-02-10 | Discharge: 2023-02-10 | Disposition: A | Discharge status: Home / Self Care | Payer: Medicaid Other

## 2023-02-10 DIAGNOSIS — H6501 Acute serous otitis media, right ear: Secondary | ICD-10-CM

## 2023-02-10 DIAGNOSIS — H9201 Otalgia, right ear: Secondary | ICD-10-CM

## 2023-02-10 DIAGNOSIS — H60501 Unspecified acute noninfective otitis externa, right ear: Secondary | ICD-10-CM

## 2023-02-10 DIAGNOSIS — H6991 Unspecified Eustachian tube disorder, right ear: Secondary | ICD-10-CM

## 2023-02-10 MED ORDER — CIPROFLOXACIN-DEXAMETHASONE 0.3-0.1 % OT SUSP: 4.0000 [drp] | Freq: Two times a day (BID) | OTIC | 0 refills | Status: AC

## 2023-02-10 MED ORDER — IPRATROPIUM BROMIDE 0.06 % NA SOLN: 2.0000 | Freq: Four times a day (QID) | NASAL | 0 refills | Status: AC

## 2023-02-10 NOTE — ED Provider Notes (Signed)
 [facility]    CSN: [id number] Arrival date & time: 02/10/23  1051      History   Chief Complaint Chief Complaint  Patient presents with   Otalgia    HPI Suzanne Berry is a 28 y.o. female presenting for approximately 2 to 3-week history of right ear pain and itching inside the ear canal.  Patient reports she has been scratching inside the ear.  She feels a deep discomfort near the eardrum.  Discomfort is intermittent.  Has had congestion over the past couple of weeks but not now.  Denies fever, sinus pain, sore throat, drainage from ear, bleeding from ear, hearing problems.  HPI  Past Medical History:  Diagnosis Date   ADHD (attention deficit hyperactivity disorder)    Anxiety     There are no active problems to display for this patient.   Past Surgical History:  Procedure Laterality Date   TONSILLECTOMY AND ADENOIDECTOMY      OB History     Gravida  0   Para  0   Term  0   Preterm  0   AB  0   Living  0      SAB  0   IAB  0   Ectopic  0   Multiple  0   Live Births               Home Medications    Prior to Admission medications   Medication Sig Start Date End Date Taking? Authorizing Provider  ciprofloxacin -dexamethasone  (CIPRODEX ) OTIC suspension Place 4 drops into the right ear 2 (two) times daily for 7 days. 02/10/23 02/17/23 Yes Arvis Huxley B, PA-C  clindamycin (CLINDAGEL) 1 % gel Apply topically. 01/26/23 01/26/24 Yes [provider]  ipratropium (ATROVENT ) 0.06 % nasal spray Place 2 sprays into both nostrils 4 (four) times daily. 02/10/23  Yes Arvis Huxley NOVAK, PA-C  lamoTRIgine (LAMICTAL) 25 MG tablet Take 1 tablet by mouth daily. 01/26/23 01/26/24 Yes [provider]  venlafaxine XR (EFFEXOR-XR) 37.5 MG 24 hr capsule Take 37.5 mg by mouth. 01/26/23 01/26/24 Yes [provider]  traMADol  (ULTRAM ) 50 MG tablet 1-2 tabs po bid prn 05/11/19   Servando Hire, MD    Family History Family History   Problem Relation Age of Onset   Thyroid disease Mother    Depression Mother    Hypertension Father     Social History Social History   Tobacco Use   Smoking status: Never   Smokeless tobacco: Never  Vaping Use   Vaping status: Every Day   Substances: Nicotine, THC, CBD, Flavoring  Substance Use Topics   Alcohol use: Yes    Comment: occasionally   Drug use: Yes    Frequency: 3.0 times per week    Types: Marijuana     Allergies   Mosquito (diagnostic)   Review of Systems Review of Systems  Constitutional:  Negative for fatigue and fever.  HENT:  Positive for ear pain. Negative for congestion, ear discharge, facial swelling, hearing loss and rhinorrhea.   Respiratory:  Negative for cough.   Neurological:  Negative for dizziness and headaches.     Physical Exam Triage Vital Signs ED Triage Vitals  Encounter Vitals Group     BP 02/10/23 1124 120/84     Systolic BP Percentile --      Diastolic BP Percentile --      Pulse Rate 02/10/23 1122 68     Resp 02/10/23 1122 18  Temp 02/10/23 1122 99.3 F (37.4 C)     Temp Source 02/10/23 1122 Oral     SpO2 02/10/23 1122 100 %     Weight --      Height --      Head Circumference --      Peak Flow --      Pain Score 02/10/23 1120 2     Pain Loc --      Pain Education --      Exclude from Growth Chart --    No data found.  Updated Vital Signs BP 120/84 (BP Location: Left Arm)   Pulse 68   Temp 99.3 F (37.4 C) (Oral)   Resp 18   SpO2 100%      Physical Exam Vitals and nursing note reviewed.  Constitutional:      General: She is not in acute distress.    Appearance: Normal appearance. She is not ill-appearing or toxic-appearing.  HENT:     Head: Normocephalic and atraumatic.     Right Ear: Hearing and external ear normal. Swelling (Mild swelling and erythema of EAC) present. A middle ear effusion is present.     Left Ear: Hearing, tympanic membrane, ear canal and external ear normal.     Nose: Nose  normal.     Mouth/Throat:     Mouth: Mucous membranes are moist.     Pharynx: Oropharynx is clear.  Eyes:     General: No scleral icterus.       Right eye: No discharge.        Left eye: No discharge.     Conjunctiva/sclera: Conjunctivae normal.  Cardiovascular:     Rate and Rhythm: Normal rate and regular rhythm.     Heart sounds: Normal heart sounds.  Pulmonary:     Effort: Pulmonary effort is normal. No respiratory distress.     Breath sounds: Normal breath sounds.  Musculoskeletal:     Cervical back: Neck supple.  Skin:    General: Skin is dry.  Neurological:     General: No focal deficit present.     Mental Status: She is alert. Mental status is at baseline.     Motor: No weakness.     Gait: Gait normal.  Psychiatric:        Mood and Affect: Mood normal.        Behavior: Behavior normal.      UC Treatments / Results  Labs (all labs ordered are listed, but only abnormal results are displayed) Labs Reviewed - No data to display  EKG   Radiology No results found.  Procedures Procedures (including critical care time)  Medications Ordered in UC Medications - No data to display  Initial Impression / Assessment and Plan / UC Course  I have reviewed the triage vital signs and the nursing notes.  Pertinent labs & imaging results that were available during my care of the patient were reviewed by me and considered in my medical decision making (see chart for details).   28 year old female presents for right ear discomfort intermittently over the past couple weeks.  Had cold-like symptoms at onset.  No hearing problems, drainage from ear bleeding.  No injury to ear.  On exam she has effusion of right TM, mild swelling and erythema of the right EAC.  She does report that she has been scratching inside her ear.  Suspect she may be developing an infection of the ear canal.  Will treat that at this time with Ciprodex .  Also suspect eustachian tube dysfunction related to  fluid behind TM.  Encouraged use of Mucinex D and Atrovent  nasal spray.  Warm compresses, I Profen Tylenol  for discomfort.  Reviewed return precautions.   Final Clinical Impressions(s) / UC Diagnoses   Final diagnoses:  Otalgia of right ear  Right acute serous otitis media, recurrence not specified  Eustachian tube dysfunction, right  Acute otitis externa of right ear, unspecified type     Discharge Instructions      -I sent an eardrop as you may be developing an infection in the ear canal from scratching inside your ear. - You also have a bit of fluid behind the eardrums I sent a nasal spray.  Also consider Mucinex D. - Tylenol  and Motrin, warm compresses as needed for discomfort.     ED Prescriptions     Medication Sig Dispense Auth. Provider   ciprofloxacin -dexamethasone  (CIPRODEX ) OTIC suspension Place 4 drops into the right ear 2 (two) times daily for 7 days. 7.5 mL Arvis Huxley B, PA-C   ipratropium (ATROVENT ) 0.06 % nasal spray Place 2 sprays into both nostrils 4 (four) times daily. 15 mL Arvis Huxley NOVAK, PA-C      PDMP not reviewed this encounter.   Arvis Huxley NOVAK, PA-C 02/10/23 1150

## 2023-02-10 NOTE — ED Triage Notes (Signed)
 Pt c/o R ear pain x2-3 weeks. Denies any other symptoms.

## 2023-02-10 NOTE — Discharge Instructions (Signed)
-  I sent an eardrop as you may be developing an infection in the ear canal from scratching inside your ear. - You also have a bit of fluid behind the eardrums I sent a nasal spray.  Also consider Mucinex D. - Tylenol  and Motrin, warm compresses as needed for discomfort.

## 2023-04-02 ENCOUNTER — Emergency Department
Admission: EM | Admit: 2023-04-02 | Discharge: 2023-04-02 | Disposition: A | Discharge status: Home / Self Care | Payer: Medicaid Other | Attending: Emergency Medicine | Admitting: Emergency Medicine

## 2023-04-02 ENCOUNTER — Emergency Department: Payer: Medicaid Other

## 2023-04-02 ENCOUNTER — Other Ambulatory Visit: Payer: Self-pay

## 2023-04-02 DIAGNOSIS — H9201 Otalgia, right ear: Secondary | ICD-10-CM | POA: Diagnosis present

## 2023-04-02 LAB — PREGNANCY, URINE: Preg Test, Ur: NEGATIVE

## 2023-04-02 MED ORDER — GABAPENTIN 100 MG PO CAPS: 100.0000 mg | ORAL_CAPSULE | Freq: Every day | ORAL | 0 refills | Status: DC

## 2023-04-02 NOTE — ED Provider Notes (Signed)
 [facility] Provider Note    Event Date/Time   First MD Initiated Contact with Patient 04/02/23 1721     (approximate)   History   Otalgia   HPI  Suzanne Berry is a 28 y.o. female who presents today with history of 2 months of right otalgia.  Patient went to urgent care, they prescribed ciprofloxacin and Atrovent nasal spray without improvement.  Patient was seen by ENT.  Did not get any treatment physical exam was normal.  She continues with right ear pain, patient denies fever nasal congestion sore throat.      Physical Exam   Triage Vital Signs: ED Triage Vitals  Encounter Vitals Group     BP 04/02/23 1544 (!) 140/101     Systolic BP Percentile --      Diastolic BP Percentile --      Pulse Rate 04/02/23 1544 (!) 103     Resp 04/02/23 1544 18     Temp 04/02/23 1544 98 F (36.7 C)     Temp src --      SpO2 04/02/23 1544 100 %     Weight 04/02/23 1542 160 lb (72.6 kg)     Height 04/02/23 1542 5\' 4"  (1.626 m)     Head Circumference --      Peak Flow --      Pain Score 04/02/23 1542 8     Pain Loc --      Pain Education --      Exclude from Growth Chart --     Most recent vital signs: Vitals:   04/02/23 1911 04/02/23 1912  BP: 108/78 108/78  Pulse: 81 81  Resp:  16  Temp:  98.8 F (37.1 C)  SpO2: 99% 99%     Constitutional: Alert nondistressed Eyes: Conjunctivae are normal.  Head: Atraumatic.  Presence of cyst in the right occipital area. Nose: No congestion/rhinnorhea. Mouth/Throat: Mucous membranes are moist.   Ears: Right ear: peritympanic erythema, tympanic membranes within normal limits.  Left ear Neck: Painless ROM.  Cardiovascular:   Good peripheral circulation. Respiratory: Normal respiratory effort.  No retractions.  Gastrointestinal: Soft and nontender.  Musculoskeletal:  no deformity Neurologic:  MAE spontaneously. No gross focal neurologic deficits are appreciated.  Skin:  Skin is warm, dry and intact. No rash  noted. Psychiatric: Mood and affect are normal. Speech and behavior are normal.    ED Results / Procedures / Treatments   Labs (all labs ordered are listed, but only abnormal results are displayed) Labs Reviewed  PREGNANCY, URINE     EKG     RADIOLOGY I independently reviewed and interpreted imaging and agree with radiologists findings.      PROCEDURES:  Critical Care performed:   Procedures   MEDICATIONS ORDERED IN ED: Medications - No data to display   IMPRESSION / MDM / ASSESSMENT AND PLAN / ED COURSE  I reviewed the triage vital signs and the nursing notes.  Differential diagnosis includes, but is not limited to, cholesteatoma, neuritis, otitis  Patient's presentation is most consistent with acute complicated illness / injury requiring diagnostic workup.   Patient's diagnosis is consistent with right otalgia. I independently reviewed and interpreted imaging and agree with radiologists findings. I did review the patient's allergies and medications. Patient will be discharged home with prescriptions for gabapentin . Patient is to follow up with dentist as needed or otherwise directed. Patient is given ED precautions to return to the ED for any worsening or new symptoms.  Discussed plan of care with patient, answered all of patient's questions, Patient agreeable to plan of care. Advised patient to take medications according to the instructions on the label. Discussed possible side effects of new medications. Patient verbalized understanding. Clinical Course as of 04/02/23 1952  Sun Apr 02, 2023  1939 Normal CT of the temporal bones. [AE]    Clinical Course User Index [AE] Gladys Damme, PA-C     FINAL CLINICAL IMPRESSION(S) / ED DIAGNOSES   Final diagnoses:  Otalgia of left ear     Rx / DC Orders   ED Discharge Orders          Ordered    gabapentin (NEURONTIN) 100 MG capsule  Daily        04/02/23 1949             Note:  This document  was prepared using Dragon voice recognition software and may include unintentional dictation errors.   Gladys Damme, PA-C 04/02/23 1952    Merwyn Katos, MD 04/03/23 2151

## 2023-04-02 NOTE — ED Triage Notes (Signed)
 Pt comes with c/o right ear pain for two months. Pt states she has been to ENT and UC and they didn't do anything. Pt states it might be nerve pain.

## 2023-04-02 NOTE — Discharge Instructions (Addendum)
 You have been diagnosed with right otalgia, please take gabapentin 1 capsule by mouth every night for pain.  Please call and make an appointment to [facility] dental faculty.  Please come back to ED or go to your PCP if you have new symptoms or symptoms worsen

## 2023-04-17 ENCOUNTER — Emergency Department
Admission: EM | Admit: 2023-04-17 | Discharge: 2023-04-17 | Disposition: A | Discharge status: Home / Self Care | Attending: Emergency Medicine | Admitting: Emergency Medicine

## 2023-04-17 ENCOUNTER — Emergency Department

## 2023-04-17 ENCOUNTER — Other Ambulatory Visit: Payer: Self-pay

## 2023-04-17 DIAGNOSIS — R1030 Lower abdominal pain, unspecified: Secondary | ICD-10-CM

## 2023-04-17 DIAGNOSIS — R102 Pelvic and perineal pain: Secondary | ICD-10-CM | POA: Diagnosis not present

## 2023-04-17 DIAGNOSIS — R110 Nausea: Secondary | ICD-10-CM | POA: Insufficient documentation

## 2023-04-17 DIAGNOSIS — I517 Cardiomegaly: Secondary | ICD-10-CM

## 2023-04-17 LAB — WET PREP, GENITAL
Clue Cells Wet Prep HPF POC: NONE SEEN
Sperm: NONE SEEN
Trich, Wet Prep: NONE SEEN
WBC, Wet Prep HPF POC: >=10 — AB (ref <10)
Yeast Wet Prep HPF POC: NONE SEEN

## 2023-04-17 LAB — COMPREHENSIVE METABOLIC PANEL
ALT: 14 U/L (ref 0–44)
AST: 21 U/L (ref 15–41)
Albumin: 3.6 g/dL (ref 3.5–5.0)
Alkaline Phosphatase: 62 U/L (ref 38–126)
Anion gap: 9 (ref 5–15)
BUN: 10 mg/dL (ref 6–20)
CO2: 21 mmol/L — ABNORMAL LOW (ref 22–32)
Calcium: 8.6 mg/dL — ABNORMAL LOW (ref 8.9–10.3)
Chloride: 107 mmol/L (ref 98–111)
Creatinine, Ser: 0.81 mg/dL (ref 0.44–1.00)
GFR, Estimated: >60 mL/min (ref >60)
Glucose, Bld: 152 mg/dL — ABNORMAL HIGH (ref 70–99)
Potassium: 3.9 mmol/L (ref 3.5–5.1)
Sodium: 137 mmol/L (ref 135–145)
Total Bilirubin: 0.4 mg/dL (ref 0.0–1.2)
Total Protein: 6.5 g/dL (ref 6.5–8.1)

## 2023-04-17 LAB — CBC
HCT: 42.1 % (ref 36.0–46.0)
Hemoglobin: 13.8 g/dL (ref 12.0–15.0)
MCH: 29.6 pg (ref 26.0–34.0)
MCHC: 32.8 g/dL (ref 30.0–36.0)
MCV: 90.3 fL (ref 80.0–100.0)
Platelets: 167 10*3/uL (ref 150–400)
RBC: 4.66 MIL/uL (ref 3.87–5.11)
RDW: 13.8 % (ref 11.5–15.5)
WBC: 5.1 10*3/uL (ref 4.0–10.5)
nRBC: 0 % (ref 0.0–0.2)

## 2023-04-17 LAB — URINALYSIS, ROUTINE W REFLEX MICROSCOPIC
Bacteria, UA: NONE SEEN
Bilirubin Urine: NEGATIVE
Glucose, UA: NEGATIVE mg/dL
Ketones, ur: NEGATIVE mg/dL
Nitrite: NEGATIVE
Protein, ur: 100 mg/dL — AB
Specific Gravity, Urine: 1.026 (ref 1.005–1.030)
pH: 5 (ref 5.0–8.0)

## 2023-04-17 LAB — CHLAMYDIA/NGC RT PCR (ARMC ONLY)
Chlamydia Tr: NOT DETECTED
N gonorrhoeae: NOT DETECTED

## 2023-04-17 LAB — POC URINE PREG, ED: Preg Test, Ur: NEGATIVE

## 2023-04-17 LAB — LIPASE, BLOOD: Lipase: 36 U/L (ref 11–51)

## 2023-04-17 MED ORDER — OXYCODONE HCL 5 MG PO TABS
5.0000 mg | ORAL_TABLET | Freq: Once | ORAL | Status: AC
  Administered 2023-04-17: 5 mg via ORAL
  Filled 2023-04-17: qty 1

## 2023-04-17 MED ORDER — IOHEXOL 300 MG/ML  SOLN
100.0000 mL | Freq: Once | INTRAMUSCULAR | Status: AC | PRN
  Administered 2023-04-17: 100 mL via INTRAVENOUS

## 2023-04-17 MED ORDER — ONDANSETRON 4 MG PO TBDP
4.0000 mg | ORAL_TABLET | Freq: Once | ORAL | Status: AC
  Administered 2023-04-17: 4 mg via ORAL
  Filled 2023-04-17: qty 1

## 2023-04-17 MED ORDER — GABAPENTIN 100 MG PO CAPS: 100.0000 mg | ORAL_CAPSULE | Freq: Every day | ORAL | 0 refills | Status: AC

## 2023-04-17 MED ORDER — HYDROCODONE-ACETAMINOPHEN 5-325 MG PO TABS
1.0000 | ORAL_TABLET | Freq: Once | ORAL | Status: AC
  Administered 2023-04-17: 1 via ORAL
  Filled 2023-04-17: qty 1

## 2023-04-17 MED ORDER — DICYCLOMINE HCL 20 MG PO TABS
20.0000 mg | ORAL_TABLET | Freq: Once | ORAL | Status: AC
Start: 2023-04-17 — End: 2023-04-17
  Administered 2023-04-17: 20 mg via ORAL
  Filled 2023-04-17: qty 1

## 2023-04-17 MED ORDER — DICYCLOMINE HCL 20 MG PO TABS: 20.0000 mg | ORAL_TABLET | Freq: Four times a day (QID) | ORAL | 0 refills | Status: AC | PRN

## 2023-04-17 MED ORDER — OXYCODONE-ACETAMINOPHEN 5-325 MG PO TABS
1.0000 | ORAL_TABLET | Freq: Once | ORAL | Status: AC
  Administered 2023-04-17: 1 via ORAL
  Filled 2023-04-17: qty 1

## 2023-04-17 MED ORDER — HYDROCODONE-ACETAMINOPHEN 5-325 MG PO TABS
1.0000 | ORAL_TABLET | Freq: Four times a day (QID) | ORAL | 0 refills | Status: AC | PRN
Start: 2023-04-17 — End: 2023-04-20

## 2023-04-17 NOTE — ED Notes (Signed)
Pt return from CT scanner, NAD noted 

## 2023-04-17 NOTE — ED Triage Notes (Signed)
 Pt comes with nausea, gas bloating and belly pain that started yesterday. Pt states some pain when urinating also.

## 2023-04-17 NOTE — Discharge Instructions (Addendum)
 You were seen in the emergency department today for evaluation of your pelvic pain and vomiting.  We fortunately not find an emergency cause for this.  Your ultrasound did show some changes in your uterus, please follow-up with OB/GYN for further evaluation.  I sent a prescription for a pain medication and anticraving medication to your pharmacy that you can take as needed.  The pain medication does make you drowsy, do not drive operate machinery when taking this.  Your CT scan also showed that your heart was larger than expected for your age.  I have placed a referral to cardiology.  If you have not heard from them, call the number below.  Return to the ER for any new or worsening symptoms.

## 2023-04-17 NOTE — ED Provider Notes (Signed)
 Care of this patient assumed from prior physician at 1900 pending CT and disposition. Please see prior physician note for further details.  Briefly this is a 28 year old female who presented with vomiting and lower pelvic pain.  Labs overall reassuring.  Urine overall not suggestive of infection. Pelvic ultrasound with trace pelvic fluid, otherwise unremarkable.  CT pending at time of signout.  Initial delay in radiology interpretation of CT.  Patient reassessed, had some ongoing pain after oral narcotic medication.  Will trial dicyclomine.  CT resulted without acute finding.  It did note irregular enhancement of the uterine fundus which can be seen with fibroids or adenomyosis.  This was not specifically noted by radiology on her pelvic ultrasound.  It also noted borderline cardiomegaly with enlarged right heart chambers.  Radiology did recommend echocardiography for further evaluation.  Patient reevaluated and updated on the results of her workup.  She has not seen OB/GYN recently, but is comfortable following up with them for further evaluation of her uterine abnormality and pain.  No known history of structural heart disease.  She denies any chest pain, shortness of breath, other cardiorespiratory complaints.  I did recommend follow-up with cardiology for further evaluation and patient is agreeable with this plan.  Will DC with short course of pain medication and dicyclomine.  Strict return precautions provided.  Patient discharged stable condition.    Trinna Post, MD 04/17/23 [phone]

## 2023-04-17 NOTE — ED Provider Notes (Cosign Needed Addendum)
 [facility] Emergency Department Provider Note     Event Date/Time   First MD Initiated Contact with Patient 04/17/23 1414     (approximate)   History   Abdominal Pain   HPI  Suzanne Berry is a 28 y.o. female G0P0 with a history of ADHD anxiety, presents to the ED endorsing 2 days of nausea without vomiting, gas described as bloating, and lower pelvic pain.  She would endorse onset yesterday after interactivity with her female partner.  She would also endorse abdominal pelvic pain and pressure with excessive flatus.  She has had some urge to go to the bathroom to pass stool, but essentially is only passing gas.  She denies any nausea, vomiting, or anorexia.  No fevers, chills, sweats reported.  No chest pain shortness of breath.  She gives a history of ovarian cyst but denies any abnormal Pap smears, endometriosis, fibroid uterus.  She denies any history of colitis, diverticulitis, pancreatitis, or appendicitis.   Physical Exam   Triage Vital Signs: ED Triage Vitals  Encounter Vitals Group     BP 04/17/23 1256 111/88     Systolic BP Percentile --      Diastolic BP Percentile --      Pulse Rate 04/17/23 1256 98     Resp 04/17/23 1256 17     Temp 04/17/23 1256 98.2 F (36.8 C)     Temp Source 04/17/23 1256 Oral     SpO2 04/17/23 1256 100 %     Weight 04/17/23 1257 163 lb (73.9 kg)     Height 04/17/23 1257 5\' 4"  (1.626 m)     Head Circumference --      Peak Flow --      Pain Score 04/17/23 1259 9     Pain Loc --      Pain Education --      Exclude from Growth Chart --     Most recent vital signs: Vitals:   04/17/23 1813 04/17/23 1907  BP: 108/72   Pulse: 70   Resp: 20   Temp:  99.2 F (37.3 C)  SpO2: 99%     General Awake, no distress. NAD HEENT NCAT. PERRL. EOMI. No rhinorrhea. Mucous membranes are moist.  CV:  Good peripheral perfusion.  RESP:  Normal effort. CTA ABD:  No distention. Soft, and mildly tender to palp over the lower  abdominal pelvic region GYN:  Normal external genitalia.  Cervix is close with scant discharge noted.  Nabothian cyst noted on the cervix.  No frank CMT but tenderness over the left adnexal region, with bimanual exam.   ED Results / Procedures / Treatments   Labs (all labs ordered are listed, but only abnormal results are displayed) Labs Reviewed  WET PREP, GENITAL - Abnormal; Notable for the following components:      Result Value   WBC, Wet Prep HPF POC >=10 (*)    All other components within normal limits  COMPREHENSIVE METABOLIC PANEL - Abnormal; Notable for the following components:   CO2 21 (*)    Glucose, Bld 152 (*)    Calcium 8.6 (*)    All other components within normal limits  URINALYSIS, ROUTINE W REFLEX MICROSCOPIC - Abnormal; Notable for the following components:   Color, Urine AMBER (*)    APPearance CLOUDY (*)    Hgb urine dipstick SMALL (*)    Protein, ur 100 (*)    Leukocytes,Ua TRACE (*)    All other components within normal  limits  CHLAMYDIA/NGC RT PCR ([facility] ONLY)            LIPASE, BLOOD  CBC  POC URINE PREG, ED     EKG   RADIOLOGY  I personally viewed and evaluated these images as part of my medical decision making, as well as reviewing the written report by the radiologist.  ED Provider Interpretation: No acute findings  US PELVIC COMPLETE W TRANSVAGINAL AND TORSION R/O Result Date: 04/17/2023 CLINICAL DATA:  Pelvic pain. EXAM: TRANSABDOMINAL AND TRANSVAGINAL ULTRASOUND OF PELVIS DOPPLER ULTRASOUND OF OVARIES TECHNIQUE: Both transabdominal and transvaginal ultrasound examinations of the pelvis were performed. Transabdominal technique was performed for global imaging of the pelvis including uterus, ovaries, adnexal regions, and pelvic cul-de-sac. It was necessary to proceed with endovaginal exam following the transabdominal exam to visualize the endometrium and ovaries. Color and duplex Doppler ultrasound was utilized to evaluate blood flow to the  ovaries. COMPARISON:  Pelvic ultrasound dated 03/02/2019. FINDINGS: Uterus Measurements: 8.6 x 3.1 x 4.5 cm = volume: 63 mL. No fibroids or other mass visualized. Endometrium Thickness: 3 mm. No focal abnormality visualized. Small amount of fluid noted in the upper endometrium as well as within the endocervical canal. Right ovary Measurements: 2.7 x 1.5 x 1.7 cm = volume: 3.5 mL. Normal appearance/no adnexal mass. Left ovary Measurements: 2.9 x 2.0 x 2.4 cm = volume: 7.1 mL. Normal appearance/no adnexal mass. Pulsed Doppler evaluation of both ovaries demonstrates normal low-resistance arterial and venous waveforms. Other findings No abnormal free fluid. IMPRESSION: Trace fluid within the endometrium, otherwise unremarkable pelvic ultrasound. Electronically Signed   By: Elgie Collard M.D.   On: 04/17/2023 18:56     PROCEDURES:  Critical Care performed: No  Procedures   MEDICATIONS ORDERED IN ED: Medications  ondansetron (ZOFRAN-ODT) disintegrating tablet 4 mg (4 mg Oral Given 04/17/23 1502)  HYDROcodone-acetaminophen (NORCO/VICODIN) 5-325 MG per tablet 1 tablet (1 tablet Oral Given 04/17/23 1501)  oxyCODONE (Oxy IR/ROXICODONE) immediate release tablet 5 mg (5 mg Oral Given 04/17/23 1834)     IMPRESSION / MDM / ASSESSMENT AND PLAN / ED COURSE  I reviewed the triage vital signs and the nursing notes.                              Differential diagnosis includes, but is not limited to, ovarian cyst, ovarian torsion, acute appendicitis, diverticulitis, urinary tract infection/pyelonephritis, bowel obstruction, colitis, renal colic, gastroenteritis, hernia, fibroids, endometriosis, pregnancy related pain including ectopic pregnancy, etc.  Patient's presentation is most consistent with acute presentation with potential threat to life or bodily function.  ----------------------------------------- 7:11 PM on 04/17/2023 ----------------------------------------- Care transferred to my attending,  Trinna Post, MD at this time.   Patient aware of her normal workup to this point including a unremarkable pelvic ultrasound.  She at this point is awaiting a CT scan to further evaluate her abdominal pelvic pain.  She is been treated with p.o. pain medicine at this time is stable.  She is aware of the potential protracted wait for CT results at this time.     FINAL CLINICAL IMPRESSION(S) / ED DIAGNOSES   Final diagnoses:  Lower abdominal pain  Pelvic pain in female     Rx / DC Orders   ED Discharge Orders     None        Note:  This document was prepared using Dragon voice recognition software and may include unintentional dictation errors.  Lissa Hoard, PA-C 04/17/23 1912    Lissa Hoard, PA-C 04/17/23 Darnell Level    Jene Every, MD 04/18/23 [phone]
